# Patient Record
Sex: Male | Born: 2000 | Race: Black or African American | Hispanic: No | Marital: Single | Smoking: Never smoker
Health system: Southern US, Community
[De-identification: ages and names within clinical notes are randomized; demographics above are authoritative.]

## PROBLEM LIST (undated history)

## (undated) DIAGNOSIS — M264 Malocclusion, unspecified: Secondary | ICD-10-CM

## (undated) HISTORY — PX: HERNIA REPAIR: SHX51

---
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
## 2021-01-14 NOTE — Progress Notes (Signed)
Erroneous encounter

## 2021-01-19 ENCOUNTER — Encounter: Payer: Medicaid Other | Admitting: Family

## 2021-01-19 DIAGNOSIS — Z1159 Encounter for screening for other viral diseases: Secondary | ICD-10-CM

## 2021-01-19 DIAGNOSIS — Z130 Encounter for screening for diseases of the blood and blood-forming organs and certain disorders involving the immune mechanism: Secondary | ICD-10-CM

## 2021-01-19 DIAGNOSIS — Z13220 Encounter for screening for lipoid disorders: Secondary | ICD-10-CM

## 2021-01-19 DIAGNOSIS — Z13228 Encounter for screening for other metabolic disorders: Secondary | ICD-10-CM

## 2021-01-19 DIAGNOSIS — Z0000 Encounter for general adult medical examination without abnormal findings: Secondary | ICD-10-CM

## 2021-01-19 DIAGNOSIS — Z114 Encounter for screening for human immunodeficiency virus [HIV]: Secondary | ICD-10-CM

## 2021-01-19 DIAGNOSIS — Z1329 Encounter for screening for other suspected endocrine disorder: Secondary | ICD-10-CM

## 2021-01-19 DIAGNOSIS — Z131 Encounter for screening for diabetes mellitus: Secondary | ICD-10-CM

## 2021-06-01 NOTE — Progress Notes (Signed)
? ? ?Patient ID: Jeff [name], male    DOB: [birth year]  MRN: [id number] ? ?CC: Annual Physical Exam ? ?Subjective: ?Jeff [name] is a 21 y.o. male who presents for annual physical exam.  ? ?His concerns today include:  ?None.  ? ?There are no problems to display for this patient. ?  ? ?No current outpatient medications on file prior to visit.  ? ?No current facility-administered medications on file prior to visit.  ? ? ?No Known Allergies ? ?Social History  ? ?Socioeconomic History  ? Marital status: Single  ?  Spouse name: Not on file  ? Number of children: Not on file  ? Years of education: Not on file  ? Highest education level: Not on file  ?Occupational History  ? Not on file  ?Tobacco Use  ? Smoking status: Never  ?  Passive exposure: Never  ? Smokeless tobacco: Never  ?Vaping Use  ? Vaping Use: Never used  ?Substance and Sexual Activity  ? Alcohol use: No  ? Drug use: No  ? Sexual activity: Never  ?Other Topics Concern  ? Not on file  ?Social History Narrative  ? Pt lives at home with mother, father,  brothers and sisters.   ? ?Social Determinants of Health  ? ?Financial Resource Strain: Not on file  ?Food Insecurity: Not on file  ?Transportation Needs: Not on file  ?Physical Activity: Not on file  ?Stress: Not on file  ?Social Connections: Not on file  ?Intimate Partner Violence: Not on file  ? ? ?No family history on file. ? ?Past Surgical History:  ?Procedure Laterality Date  ? HERNIA REPAIR    ? MULTIPLE EXTRACTIONS WITH ALVEOLOPLASTY N/A 01/06/2015  ? Procedure: MULTIPLE EXTRACTIONS OF TEETH FIVE, TWELVE, C, H AND I;  Surgeon: Diona Browner, DDS;  Location: [facility];  Service: Oral Surgery;  Laterality: N/A;  ? ? ?ROS: ?Review of Systems ?Negative except as stated above ? ?PHYSICAL EXAM: ?BP 110/76 (BP Location: Left Arm, Patient Position: Sitting, Cuff Size: Normal)   Pulse 72   Temp 98.3 ?F (36.8 ?C)   Resp 18   Wt 120 lb (54.4 kg)   SpO2 97%  ? ?Physical Exam ?HENT:  ?   Head: Normocephalic and  atraumatic.  ?   Right Ear: Tympanic membrane, ear canal and external ear normal.  ?   Left Ear: Tympanic membrane, ear canal and external ear normal.  ?   Nose: Nose normal.  ?   Mouth/Throat:  ?   Mouth: Mucous membranes are moist.  ?   Pharynx: Oropharynx is clear.  ?Eyes:  ?   Extraocular Movements: Extraocular movements intact.  ?   Conjunctiva/sclera: Conjunctivae normal.  ?   Pupils: Pupils are equal, round, and reactive to light.  ?Cardiovascular:  ?   Rate and Rhythm: Normal rate and regular rhythm.  ?   Pulses: Normal pulses.  ?   Heart sounds: Normal heart sounds.  ?Pulmonary:  ?   Effort: Pulmonary effort is normal.  ?   Breath sounds: Normal breath sounds.  ?Abdominal:  ?   General: Bowel sounds are normal.  ?   Palpations: Abdomen is soft.  ?Genitourinary: ?   Comments: Patient declined.  ?Musculoskeletal:     ?   General: Normal range of motion.  ?   Cervical back: Normal range of motion and neck supple.  ?Skin: ?   General: Skin is warm and dry.  ?   Capillary Refill: Capillary refill takes less than  2 seconds.  ?Neurological:  ?   General: No focal deficit present.  ?   Mental Status: He is alert and oriented to person, place, and time.  ?Psychiatric:     ?   Mood and Affect: Mood normal.     ?   Behavior: Behavior normal.  ? ? ?ASSESSMENT AND PLAN: ?1. Annual physical exam: ?- Counseled on 150 minutes of exercise per week as tolerated, healthy eating (including decreased daily intake of saturated fats, cholesterol, added sugars, sodium), STI prevention, and routine healthcare maintenance. ? ?2. Screening for metabolic disorder: ?- EYE23+VKPQ to check kidney function, liver function, and electrolyte balance.  ?- CMP14+EGFR ? ?3. Screening for deficiency anemia: ?- CBC to screen for anemia. ?- CBC ? ?4. Diabetes mellitus screening: ?- Hemoglobin A1c to screen for pre-diabetes/diabetes. ?- Hemoglobin A1c ? ?5. Screening cholesterol level: ?- Lipid panel to screen for high cholesterol.  ?- Lipid  panel ? ?6. Thyroid disorder screen: ?- TSH to check thyroid function.  ?- TSH ? ?7. Need for hepatitis C screening test: ?- Hepatitis C antibody to screen for hepatitis C.  ?- Hepatitis C Antibody ? ?8. Encounter for screening for HIV: ?- HIV antibody to screen for human immunodeficiency virus.  ?- HIV antibody (with reflex) ? ?9. Need for meningococcal vaccination: ?- Administered today in office.  ?- Meningococcal B, OMV ?- MenQuadfi-Meningococcal (Groups A, C, Y, W) Conjugate Vaccine ? ? ? ?Patient was given the opportunity to ask questions.  Patient verbalized understanding of the plan and was able to repeat key elements of the plan. Patient was given clear instructions to go to Emergency Department or return to medical center if symptoms don't improve, worsen, or new problems develop.The patient verbalized understanding. ? ? ?Orders Placed This Encounter  ?Procedures  ? Meningococcal B, OMV  ? MenQuadfi-Meningococcal (Groups A, C, Y, W) Conjugate Vaccine  ? HIV antibody (with reflex)  ? Hepatitis C Antibody  ? CBC  ? Lipid panel  ? TSH  ? CMP14+EGFR  ? Hemoglobin A1c  ? ? ? ?Return in about 1 year (around 06/07/2022) for Physical per patient preference. ? ?Camillia Herter, NP  ?

## 2021-06-06 ENCOUNTER — Ambulatory Visit (INDEPENDENT_AMBULATORY_CARE_PROVIDER_SITE_OTHER): Payer: Medicaid Other | Admitting: Family

## 2021-06-06 ENCOUNTER — Encounter: Payer: Self-pay | Admitting: Family

## 2021-06-06 VITALS — BP 110/76 | HR 72 | Temp 98.3°F | Resp 18 | Wt 120.0 lb

## 2021-06-06 DIAGNOSIS — Z1159 Encounter for screening for other viral diseases: Secondary | ICD-10-CM

## 2021-06-06 DIAGNOSIS — Z130 Encounter for screening for diseases of the blood and blood-forming organs and certain disorders involving the immune mechanism: Secondary | ICD-10-CM

## 2021-06-06 DIAGNOSIS — Z23 Encounter for immunization: Secondary | ICD-10-CM | POA: Diagnosis not present

## 2021-06-06 DIAGNOSIS — Z0000 Encounter for general adult medical examination without abnormal findings: Secondary | ICD-10-CM | POA: Diagnosis not present

## 2021-06-06 DIAGNOSIS — Z13220 Encounter for screening for lipoid disorders: Secondary | ICD-10-CM

## 2021-06-06 DIAGNOSIS — Z131 Encounter for screening for diabetes mellitus: Secondary | ICD-10-CM | POA: Diagnosis not present

## 2021-06-06 DIAGNOSIS — Z114 Encounter for screening for human immunodeficiency virus [HIV]: Secondary | ICD-10-CM

## 2021-06-06 DIAGNOSIS — Z1329 Encounter for screening for other suspected endocrine disorder: Secondary | ICD-10-CM

## 2021-06-06 DIAGNOSIS — Z13228 Encounter for screening for other metabolic disorders: Secondary | ICD-10-CM

## 2021-06-06 NOTE — Progress Notes (Signed)
.  Pt presents for annual physical exam  

## 2021-06-06 NOTE — Patient Instructions (Signed)
Preventive Care 18-21 Years Old, Male ?Preventive care refers to lifestyle choices and visits with your health care provider that can promote health and wellness. At this stage in your life, you may start seeing a primary care physician instead of a pediatrician for your preventive care. Preventive care visits are also called wellness exams. ?What can I expect for my preventive care visit? ?Counseling ?During your preventive care visit, your health care provider may ask about your: ?Medical history, including: ?Past medical problems. ?Family medical history. ?Current health, including: ?Home life and relationship well-being. ?Emotional well-being. ?Sexual activity and sexual health. ?Lifestyle, including: ?Alcohol, nicotine or tobacco, and drug use. ?Access to firearms. ?Diet, exercise, and sleep habits. ?Sunscreen use. ?Motor vehicle safety. ?Physical exam ?Your health care provider may check your: ?Height and weight. These may be used to calculate your BMI (body mass index). BMI is a measurement that tells if you are at a healthy weight. ?Waist circumference. This measures the distance around your waistline. This measurement also tells if you are at a healthy weight and may help predict your risk of certain diseases, such as type 2 diabetes and high blood pressure. ?Heart rate and blood pressure. ?Body temperature. ?Skin for abnormal spots. ?What immunizations do I need? ? ?Vaccines are usually given at various ages, according to a schedule. Your health care provider will recommend vaccines for you based on your age, medical history, and lifestyle or other factors, such as travel or where you work. ?What tests do I need? ?Screening ?Your health care provider may recommend screening tests for certain conditions. This may include: ?Vision and hearing tests. ?Lipid and cholesterol levels. ?Hepatitis B test. ?Hepatitis C test. ?HIV (human immunodeficiency virus) test. ?STI (sexually transmitted infection) testing, if  you are at risk. ?Tuberculosis skin test. ?Talk with your health care provider about your test results, treatment options, and if necessary, the need for more tests. ?Follow these instructions at home: ?Eating and drinking ? ?Eat a healthy diet that includes fresh fruits and vegetables, whole grains, lean protein, and low-fat dairy products. ?Drink enough fluid to keep your urine pale yellow. ?Do not drink alcohol if: ?Your health care provider tells you not to drink. ?You are under the legal drinking age. In the [place] the legal drinking age is 21. ?If you drink alcohol: ?Limit how much you have to 0-2 drinks a day. ?Know how much alcohol is in your drink. In the [place] one drink equals one 12 oz bottle of beer (355 mL), one 5 oz glass of wine (148 mL), or one 1? oz glass of hard liquor (44 mL). ?Lifestyle ?Brush your teeth every morning and night with fluoride toothpaste. Floss one time each day. ?Exercise for at least 30 minutes 5 or more days of the week. ?Do not use any products that contain nicotine or tobacco. These products include cigarettes, chewing tobacco, and vaping devices, such as e-cigarettes. If you need help quitting, ask your health care provider. ?Do not use drugs. ?If you are sexually active, practice safe sex. Use a condom or other form of protection to prevent STIs. ?Find healthy ways to manage stress, such as: ?Meditation, yoga, or listening to music. ?Journaling. ?Talking to a trusted person. ?Spending time with friends and family. ?Safety ?Always wear your seat belt while driving or riding in a vehicle. ?Do not drive: ?If you have been drinking alcohol. Do not ride with someone who has been drinking. ?When you are tired or distracted. ?While texting. ?If you have been using   any mind-altering substances or drugs. ?Wear a helmet and other protective equipment during sports activities. ?If you have firearms in your house, make sure you follow all gun safety procedures. ?Seek help if you have  been bullied, physically abused, or sexually abused. ?Use the internet responsibly to avoid dangers, such as online bullying and online sex predators. ?What's next? ?Go to your health care provider once a year for an annual wellness visit. ?Ask your health care provider how often you should have your eyes and teeth checked. ?Stay up to date on all vaccines. ?This information is not intended to replace advice given to you by your health care provider. Make sure you discuss any questions you have with your health care provider. ?Document Revised: 07/19/2020 Document Reviewed: 07/19/2020 ?Elsevier Patient Education ? 2023 Elsevier Inc. ? ?

## 2021-06-07 LAB — CMP14+EGFR
ALT: 21 IU/L (ref 0–44)
AST: 27 IU/L (ref 0–40)
Albumin/Globulin Ratio: 1.2 (ref 1.2–2.2)
Albumin: 4.2 g/dL (ref 4.1–5.2)
Alkaline Phosphatase: 106 IU/L (ref 51–125)
BUN/Creatinine Ratio: 11 (ref 9–20)
BUN: 10 mg/dL (ref 6–20)
Bilirubin Total: 0.2 mg/dL (ref 0.0–1.2)
CO2: 23 mmol/L (ref 20–29)
Calcium: 9.3 mg/dL (ref 8.7–10.2)
Chloride: 100 mmol/L (ref 96–106)
Creatinine, Ser: 0.95 mg/dL (ref 0.76–1.27)
Globulin, Total: 3.5 g/dL (ref 1.5–4.5)
Glucose: 78 mg/dL (ref 70–99)
Potassium: 4.7 mmol/L (ref 3.5–5.2)
Sodium: 138 mmol/L (ref 134–144)
Total Protein: 7.7 g/dL (ref 6.0–8.5)
eGFR: 118 mL/min/{1.73_m2} (ref >59)

## 2021-06-07 LAB — HEPATITIS C ANTIBODY: Hep C Virus Ab: NONREACTIVE

## 2021-06-07 LAB — LIPID PANEL
Chol/HDL Ratio: 3.3 ratio (ref 0.0–5.0)
Cholesterol, Total: 114 mg/dL (ref 100–169)
HDL: 35 mg/dL — ABNORMAL LOW (ref >39)
LDL Chol Calc (NIH): 68 mg/dL (ref 0–109)
Triglycerides: 45 mg/dL (ref 0–89)
VLDL Cholesterol Cal: 11 mg/dL (ref 5–40)

## 2021-06-07 LAB — HIV ANTIBODY (ROUTINE TESTING W REFLEX): HIV Screen 4th Generation wRfx: NONREACTIVE

## 2021-06-07 LAB — TSH: TSH: 0.696 u[IU]/mL (ref 0.450–4.500)

## 2021-06-07 LAB — HEMOGLOBIN A1C
Est. average glucose Bld gHb Est-mCnc: 114 mg/dL
Hgb A1c MFr Bld: 5.6 % (ref 4.8–5.6)

## 2021-06-07 LAB — CBC
Hematocrit: 45.3 % (ref 37.5–51.0)
Hemoglobin: 15.3 g/dL (ref 13.0–17.7)
MCH: 30.2 pg (ref 26.6–33.0)
MCHC: 33.8 g/dL (ref 31.5–35.7)
MCV: 89 fL (ref 79–97)
Platelets: 384 10*3/uL (ref 150–450)
RBC: 5.07 x10E6/uL (ref 4.14–5.80)
RDW: 11.9 % (ref 11.6–15.4)
WBC: 5.2 10*3/uL (ref 3.4–10.8)

## 2021-06-07 NOTE — Progress Notes (Signed)
-    Kidney function and electrolytes normal.  ?-  Liver function normal. ?-  Thyroid function normal.  ?-  No diabetes.  ?-  No anemia.  ?-  Hepatitis C negative.  ?-  HIV negative.  ?-  HDL (sometimes called "good cholesterol") lower than normal. Consider eating more fruits, vegetables, and lean baked meats such as chicken or fish. Moderate intensity exercise at least 150 minutes as tolerated per week may help as well. Recheck fasting cholesterol in 3 to 6 months.  ? ?All other values are normal, stable or within acceptable limits. ? ?Rema Fendt, NP 06/07/2021 11:20 AM  ?

## 2022-09-09 ENCOUNTER — Telehealth: Payer: Self-pay | Admitting: Family

## 2022-09-09 NOTE — Telephone Encounter (Signed)
Called pt to schedule appt requested via MyChart; no answer.

## 2022-12-03 ENCOUNTER — Encounter: Payer: Medicaid Other | Admitting: Family

## 2022-12-03 ENCOUNTER — Telehealth: Payer: Self-pay | Admitting: Family

## 2022-12-03 NOTE — Telephone Encounter (Signed)
Called pt and could not reach or leave vm to reschedule missed appt

## 2022-12-03 NOTE — Progress Notes (Signed)
Erroneous encounter-disregard
